# Patient Record
Sex: Female | Born: 1960 | Race: White | Hispanic: No | Marital: Married | Smoking: Never smoker
Health system: Southern US, Community
[De-identification: ages and names within clinical notes are randomized; demographics above are authoritative.]

## PROBLEM LIST (undated history)

## (undated) DIAGNOSIS — J45909 Unspecified asthma, uncomplicated: Secondary | ICD-10-CM

## (undated) DIAGNOSIS — F32A Depression, unspecified: Secondary | ICD-10-CM

## (undated) DIAGNOSIS — D649 Anemia, unspecified: Secondary | ICD-10-CM

## (undated) DIAGNOSIS — G4730 Sleep apnea, unspecified: Secondary | ICD-10-CM

## (undated) DIAGNOSIS — F419 Anxiety disorder, unspecified: Secondary | ICD-10-CM

## (undated) DIAGNOSIS — T7840XA Allergy, unspecified, initial encounter: Secondary | ICD-10-CM

## (undated) HISTORY — DX: Unspecified asthma, uncomplicated: J45.909

## (undated) HISTORY — DX: Anemia, unspecified: D64.9

## (undated) HISTORY — DX: Anxiety disorder, unspecified: F41.9

## (undated) HISTORY — DX: Sleep apnea, unspecified: G47.30

## (undated) HISTORY — DX: Allergy, unspecified, initial encounter: T78.40XA

## (undated) HISTORY — DX: Depression, unspecified: F32.A

---

## 1992-11-04 HISTORY — PX: DILATION AND CURETTAGE OF UTERUS: SHX78

## 2005-09-05 ENCOUNTER — Encounter: Payer: Self-pay | Admitting: Pulmonary Disease

## 2010-04-03 ENCOUNTER — Encounter: Payer: Self-pay | Admitting: Pulmonary Disease

## 2010-04-03 ENCOUNTER — Ambulatory Visit (HOSPITAL_BASED_OUTPATIENT_CLINIC_OR_DEPARTMENT_OTHER): Admission: RE | Admit: 2010-04-03 | Discharge: 2010-04-03 | Payer: Self-pay | Admitting: Otolaryngology

## 2010-04-06 ENCOUNTER — Ambulatory Visit: Payer: Self-pay | Admitting: Internal Medicine

## 2010-06-29 ENCOUNTER — Ambulatory Visit: Payer: Self-pay | Admitting: Pulmonary Disease

## 2010-06-29 DIAGNOSIS — J309 Allergic rhinitis, unspecified: Secondary | ICD-10-CM | POA: Insufficient documentation

## 2010-06-29 DIAGNOSIS — G4733 Obstructive sleep apnea (adult) (pediatric): Secondary | ICD-10-CM | POA: Insufficient documentation

## 2010-12-04 NOTE — Assessment & Plan Note (Signed)
Summary: consult for osa   Copy to:  [facility] Jasmine Cardenas/Referring Jasmine Cardenas:  Dr. Maisie Cardenas ([facility])  CC:  Sleep Consu.  History of Present Illness: The pt is a 50y/o female who I have been asked to see for management of osa.  She had a sleep study in 2006 at [facility] where she had mild osa, with AHI of 18/hr and desat to 84%.  She underwent a sleep study at [facility] in May of this year, and found to have severe osa, with AHI 90/hr and desat to 78%.  She was placed on cpap, and found to have optimal pressure of 22cm.  She has been started on cpap, and having multiple tolerance issues.  She currently is on auto setting, with her machine spending a fairly significant amount of time at a pressure of 19-20cm.  Despite this, she is compliant with the device, and uses greater than 4 hours 75% of the time in her July readings.  She is c/o head and sinus congestion, along with dizziness.  She has terrrible dryness despite using h/h, and feels the pressure is too much.  She notes facial swelling in the am's, and I suspect that is due to pulling mask too tight.  She feels the number of awakenings during the night have decreased signficantly, and although feels better in am's, does not feel rested.  She has developed a whole new set of am issues as above.  She still has sleepiness during the day, and her epworth score today is 12.  Of note, her weight has increased 25-30 pounds over the last 2 years.  Preventive Screening-Counseling & Management  Alcohol-Tobacco     Smoking Status: never  Current Medications (verified): 1)  Fluticasone Propionate 50 Mcg/act Susp (Fluticasone Propionate) .Marland Kitchen.. 1 Spray Each Nostril Once Daily 2)  Advair Diskus 250-50 Mcg/dose Aepb (Fluticasone-Salmeterol) .Marland Kitchen.. 1 Puff By Inhalation Two Times A Day 3)  Fluoxetine Hcl 20 Mg Tabs (Fluoxetine Hcl) .... Three Times A Day 4)  Patanase 0.6 % Soln (Olopatadine Hcl) .... 2 Sprays Intranasal Route Two Times A  Day in Each Nostril 5)  Proair Hfa 108 (90 Base) Mcg/act  Aers (Albuterol Sulfate) .Marland Kitchen.. 1-2 Puffs Every 4-6 Hours As Needed  Allergies (verified): No Known Drug Allergies  Past History:  Past Medical History:  OBSTRUCTIVE SLEEP APNEA (ICD-327.23)--AHI 90/hr 2011. ALLERGIC RHINITIS (ICD-477.9)    Past Surgical History: D&C  Family History: Reviewed history and no changes required. allergies: father, sister asthma: father heart disease: paternal grandfather cancer: maternal grandmother (lung) maternal grandfather (unsure)   Social History: Reviewed history and no changes required. Patient never smoked.  pt is married and lives with husband, Jasmine Cardenas. Pt has children. pt is a homemaker. Smoking Status:  never  Review of Systems  The patient denies shortness of breath with activity, shortness of breath at rest, productive cough, non-productive cough, coughing up blood, chest pain, irregular heartbeats, acid heartburn, indigestion, loss of appetite, weight change, abdominal pain, difficulty swallowing, sore throat, tooth/dental problems, headaches, nasal congestion/difficulty breathing through nose, sneezing, itching, ear ache, anxiety, depression, hand/feet swelling, joint stiffness or pain, rash, change in color of mucus, and fever.    Vital Signs:  Patient profile:   50 year old female Height:      66.5 inches Weight:      265.50 pounds BMI:     42.36 O2 Sat:      99 % on Room air Temp:     98.9 degrees  F oral Pulse rate:   75 / minute BP sitting:   140 / 70  (left arm) Cuff size:   large  Vitals Entered By: Arman Filter LPN (June 29, 2010 10:22 AM)  O2 Flow:  Room air CC: Sleep Consu Comments Medications reviewed with patient  Arman Filter LPN  June 29, 2010 10:24 AM    Physical Exam  General:  obese female in nad Eyes:  PERRLA and EOMI.   Nose:  narrowed bilat, with turbinate hypertrophy Mouth:  signficant elongation of soft palate and uvula Neck:  no  jvd, tmg, LN Lungs:  clear to auscultation Heart:  rrr, mrg Abdomen:  soft and nontender, bs+ Extremities:  mild edema, no cyanosis pulses intact distally Neurologic:  alert and oriented, moves all 4.   Impression & Recommendations:  Problem # 1:  OBSTRUCTIVE SLEEP APNEA (ICD-327.23) the pt has severe osa by her recent sleep study, with fairly high pressure requirements.  She is having issues with mask leaks and mask comfort, and I suspect it is related to pulling the mask too tight in order to prevent leaks.  She also is having pressure tolerance issues, and air gulping.  I rarely see a pt who can tolerate pressure greater than 18cm.  At this point, I would like to decrease her pressure to 16cm, and accept breakthru apnea as a trade off for improved compliance and better mask comfort.  We can then slowly increase the pressure and see where her breakpoint is.  I also wonder whether she would benefit from nasal/upper airway surgery in order to decrease her pressure needs (not cure).  The pt is agreeable to trying lower pressure, and I have also encouraged her to work aggressively on weight loss.  Medications Added to Medication List This Visit: 1)  Proair Hfa 108 (90 Base) Mcg/act Aers (Albuterol sulfate) .Marland Kitchen.. 1-2 puffs every 4-6 hours as needed  Other Orders: Consultation Level IV (16109) DME Referral (DME) DME Referral (DME)  Patient Instructions: 1)  will decrease cpap to a set pressure of 16cm.  See if you can decrease tension on mask straps. 2)  work on weight loss. 3)  please call me in 3 weeks to give update about tolerance.  We can arrange followup at that time.    Appended Document: consult for osa download since pressure reduction to 16cm shows great compliance, above average time in leak, and average AHI 11.5.  pt needs ov to f/u with me about cpap.  Appended Document: consult for osa attempt to call pt but they said the person i was calling for no longer works their. will  send not out to pt's home stating for her to give our office a call

## 2016-02-16 DIAGNOSIS — G4733 Obstructive sleep apnea (adult) (pediatric): Secondary | ICD-10-CM | POA: Diagnosis not present

## 2016-02-22 DIAGNOSIS — F330 Major depressive disorder, recurrent, mild: Secondary | ICD-10-CM | POA: Diagnosis not present

## 2016-02-27 DIAGNOSIS — G4733 Obstructive sleep apnea (adult) (pediatric): Secondary | ICD-10-CM | POA: Diagnosis not present

## 2016-02-28 DIAGNOSIS — J4520 Mild intermittent asthma, uncomplicated: Secondary | ICD-10-CM | POA: Diagnosis not present

## 2016-02-28 DIAGNOSIS — R5383 Other fatigue: Secondary | ICD-10-CM | POA: Diagnosis not present

## 2016-02-28 DIAGNOSIS — G4733 Obstructive sleep apnea (adult) (pediatric): Secondary | ICD-10-CM | POA: Diagnosis not present

## 2016-03-14 DIAGNOSIS — R252 Cramp and spasm: Secondary | ICD-10-CM | POA: Diagnosis not present

## 2016-03-14 DIAGNOSIS — Z0000 Encounter for general adult medical examination without abnormal findings: Secondary | ICD-10-CM | POA: Diagnosis not present

## 2016-03-14 DIAGNOSIS — Z79899 Other long term (current) drug therapy: Secondary | ICD-10-CM | POA: Diagnosis not present

## 2016-03-14 DIAGNOSIS — Z1389 Encounter for screening for other disorder: Secondary | ICD-10-CM | POA: Diagnosis not present

## 2016-03-29 DIAGNOSIS — E785 Hyperlipidemia, unspecified: Secondary | ICD-10-CM | POA: Diagnosis not present

## 2016-03-29 DIAGNOSIS — R7303 Prediabetes: Secondary | ICD-10-CM | POA: Diagnosis not present

## 2016-03-29 DIAGNOSIS — F330 Major depressive disorder, recurrent, mild: Secondary | ICD-10-CM | POA: Diagnosis not present

## 2016-03-29 DIAGNOSIS — I10 Essential (primary) hypertension: Secondary | ICD-10-CM | POA: Diagnosis not present

## 2016-04-17 DIAGNOSIS — J4520 Mild intermittent asthma, uncomplicated: Secondary | ICD-10-CM | POA: Diagnosis not present

## 2016-04-17 DIAGNOSIS — R5383 Other fatigue: Secondary | ICD-10-CM | POA: Diagnosis not present

## 2016-04-17 DIAGNOSIS — G4733 Obstructive sleep apnea (adult) (pediatric): Secondary | ICD-10-CM | POA: Diagnosis not present

## 2016-04-18 DIAGNOSIS — G4733 Obstructive sleep apnea (adult) (pediatric): Secondary | ICD-10-CM | POA: Diagnosis not present

## 2016-04-22 DIAGNOSIS — F39 Unspecified mood [affective] disorder: Secondary | ICD-10-CM | POA: Diagnosis not present

## 2016-04-22 DIAGNOSIS — N912 Amenorrhea, unspecified: Secondary | ICD-10-CM | POA: Diagnosis not present

## 2016-04-22 DIAGNOSIS — G479 Sleep disorder, unspecified: Secondary | ICD-10-CM | POA: Diagnosis not present

## 2016-04-22 DIAGNOSIS — F330 Major depressive disorder, recurrent, mild: Secondary | ICD-10-CM | POA: Diagnosis not present

## 2016-07-10 DIAGNOSIS — J4520 Mild intermittent asthma, uncomplicated: Secondary | ICD-10-CM | POA: Diagnosis not present

## 2016-07-10 DIAGNOSIS — G4733 Obstructive sleep apnea (adult) (pediatric): Secondary | ICD-10-CM | POA: Diagnosis not present

## 2016-07-10 DIAGNOSIS — R5383 Other fatigue: Secondary | ICD-10-CM | POA: Diagnosis not present

## 2016-07-11 DIAGNOSIS — G4733 Obstructive sleep apnea (adult) (pediatric): Secondary | ICD-10-CM | POA: Diagnosis not present

## 2016-07-19 DIAGNOSIS — N951 Menopausal and female climacteric states: Secondary | ICD-10-CM | POA: Diagnosis not present

## 2016-07-19 DIAGNOSIS — Z01419 Encounter for gynecological examination (general) (routine) without abnormal findings: Secondary | ICD-10-CM | POA: Diagnosis not present

## 2016-07-19 DIAGNOSIS — Z1151 Encounter for screening for human papillomavirus (HPV): Secondary | ICD-10-CM | POA: Diagnosis not present

## 2016-07-19 DIAGNOSIS — Z6841 Body Mass Index (BMI) 40.0 and over, adult: Secondary | ICD-10-CM | POA: Diagnosis not present

## 2016-08-14 DIAGNOSIS — N921 Excessive and frequent menstruation with irregular cycle: Secondary | ICD-10-CM | POA: Diagnosis not present

## 2016-08-14 DIAGNOSIS — Z6841 Body Mass Index (BMI) 40.0 and over, adult: Secondary | ICD-10-CM | POA: Diagnosis not present

## 2016-08-14 DIAGNOSIS — N951 Menopausal and female climacteric states: Secondary | ICD-10-CM | POA: Diagnosis not present

## 2016-08-14 DIAGNOSIS — N920 Excessive and frequent menstruation with regular cycle: Secondary | ICD-10-CM | POA: Diagnosis not present

## 2016-09-07 DIAGNOSIS — G4733 Obstructive sleep apnea (adult) (pediatric): Secondary | ICD-10-CM | POA: Diagnosis not present

## 2016-09-16 DIAGNOSIS — F419 Anxiety disorder, unspecified: Secondary | ICD-10-CM | POA: Diagnosis not present

## 2016-09-16 DIAGNOSIS — Z6841 Body Mass Index (BMI) 40.0 and over, adult: Secondary | ICD-10-CM | POA: Diagnosis not present

## 2016-12-05 DIAGNOSIS — J069 Acute upper respiratory infection, unspecified: Secondary | ICD-10-CM | POA: Diagnosis not present

## 2016-12-05 DIAGNOSIS — J029 Acute pharyngitis, unspecified: Secondary | ICD-10-CM | POA: Diagnosis not present

## 2016-12-05 DIAGNOSIS — R05 Cough: Secondary | ICD-10-CM | POA: Diagnosis not present

## 2017-01-17 DIAGNOSIS — G4733 Obstructive sleep apnea (adult) (pediatric): Secondary | ICD-10-CM | POA: Diagnosis not present

## 2017-01-21 DIAGNOSIS — J4520 Mild intermittent asthma, uncomplicated: Secondary | ICD-10-CM | POA: Diagnosis not present

## 2017-01-21 DIAGNOSIS — J301 Allergic rhinitis due to pollen: Secondary | ICD-10-CM | POA: Diagnosis not present

## 2017-01-21 DIAGNOSIS — G4733 Obstructive sleep apnea (adult) (pediatric): Secondary | ICD-10-CM | POA: Diagnosis not present

## 2017-01-21 DIAGNOSIS — R5383 Other fatigue: Secondary | ICD-10-CM | POA: Diagnosis not present

## 2017-01-29 DIAGNOSIS — R05 Cough: Secondary | ICD-10-CM | POA: Diagnosis not present

## 2017-01-29 DIAGNOSIS — J029 Acute pharyngitis, unspecified: Secondary | ICD-10-CM | POA: Diagnosis not present

## 2017-01-29 DIAGNOSIS — J101 Influenza due to other identified influenza virus with other respiratory manifestations: Secondary | ICD-10-CM | POA: Diagnosis not present

## 2017-02-04 DIAGNOSIS — R509 Fever, unspecified: Secondary | ICD-10-CM | POA: Diagnosis not present

## 2017-02-04 DIAGNOSIS — H65192 Other acute nonsuppurative otitis media, left ear: Secondary | ICD-10-CM | POA: Diagnosis not present

## 2017-02-04 DIAGNOSIS — J101 Influenza due to other identified influenza virus with other respiratory manifestations: Secondary | ICD-10-CM | POA: Diagnosis not present

## 2017-02-04 DIAGNOSIS — R490 Dysphonia: Secondary | ICD-10-CM | POA: Diagnosis not present

## 2017-02-04 DIAGNOSIS — J111 Influenza due to unidentified influenza virus with other respiratory manifestations: Secondary | ICD-10-CM | POA: Diagnosis not present

## 2017-02-24 DIAGNOSIS — J301 Allergic rhinitis due to pollen: Secondary | ICD-10-CM | POA: Diagnosis not present

## 2017-02-26 DIAGNOSIS — E538 Deficiency of other specified B group vitamins: Secondary | ICD-10-CM | POA: Diagnosis not present

## 2017-02-26 DIAGNOSIS — R7303 Prediabetes: Secondary | ICD-10-CM | POA: Diagnosis not present

## 2017-02-26 DIAGNOSIS — I10 Essential (primary) hypertension: Secondary | ICD-10-CM | POA: Diagnosis not present

## 2017-02-26 DIAGNOSIS — E559 Vitamin D deficiency, unspecified: Secondary | ICD-10-CM | POA: Diagnosis not present

## 2017-02-26 DIAGNOSIS — E785 Hyperlipidemia, unspecified: Secondary | ICD-10-CM | POA: Diagnosis not present

## 2017-06-10 DIAGNOSIS — J4520 Mild intermittent asthma, uncomplicated: Secondary | ICD-10-CM | POA: Diagnosis not present

## 2017-06-10 DIAGNOSIS — R5383 Other fatigue: Secondary | ICD-10-CM | POA: Diagnosis not present

## 2017-06-10 DIAGNOSIS — G4733 Obstructive sleep apnea (adult) (pediatric): Secondary | ICD-10-CM | POA: Diagnosis not present

## 2017-06-18 DIAGNOSIS — Z1212 Encounter for screening for malignant neoplasm of rectum: Secondary | ICD-10-CM | POA: Diagnosis not present

## 2017-06-18 DIAGNOSIS — Z1389 Encounter for screening for other disorder: Secondary | ICD-10-CM | POA: Diagnosis not present

## 2017-06-18 DIAGNOSIS — Z1211 Encounter for screening for malignant neoplasm of colon: Secondary | ICD-10-CM | POA: Diagnosis not present

## 2017-06-18 DIAGNOSIS — Z0000 Encounter for general adult medical examination without abnormal findings: Secondary | ICD-10-CM | POA: Diagnosis not present

## 2017-12-09 DIAGNOSIS — G4733 Obstructive sleep apnea (adult) (pediatric): Secondary | ICD-10-CM | POA: Diagnosis not present

## 2017-12-09 DIAGNOSIS — J4520 Mild intermittent asthma, uncomplicated: Secondary | ICD-10-CM | POA: Diagnosis not present

## 2017-12-15 DIAGNOSIS — G4733 Obstructive sleep apnea (adult) (pediatric): Secondary | ICD-10-CM | POA: Diagnosis not present

## 2017-12-29 DIAGNOSIS — R21 Rash and other nonspecific skin eruption: Secondary | ICD-10-CM | POA: Diagnosis not present

## 2017-12-29 DIAGNOSIS — F330 Major depressive disorder, recurrent, mild: Secondary | ICD-10-CM | POA: Diagnosis not present

## 2018-02-03 DIAGNOSIS — I10 Essential (primary) hypertension: Secondary | ICD-10-CM | POA: Diagnosis not present

## 2018-02-03 DIAGNOSIS — E559 Vitamin D deficiency, unspecified: Secondary | ICD-10-CM | POA: Diagnosis not present

## 2018-02-03 DIAGNOSIS — E785 Hyperlipidemia, unspecified: Secondary | ICD-10-CM | POA: Diagnosis not present

## 2018-02-03 DIAGNOSIS — R7303 Prediabetes: Secondary | ICD-10-CM | POA: Diagnosis not present

## 2018-02-03 DIAGNOSIS — E538 Deficiency of other specified B group vitamins: Secondary | ICD-10-CM | POA: Diagnosis not present

## 2018-03-06 DIAGNOSIS — G4733 Obstructive sleep apnea (adult) (pediatric): Secondary | ICD-10-CM | POA: Diagnosis not present

## 2018-06-09 DIAGNOSIS — G4733 Obstructive sleep apnea (adult) (pediatric): Secondary | ICD-10-CM | POA: Diagnosis not present

## 2018-06-10 DIAGNOSIS — R5383 Other fatigue: Secondary | ICD-10-CM | POA: Diagnosis not present

## 2018-06-10 DIAGNOSIS — G4733 Obstructive sleep apnea (adult) (pediatric): Secondary | ICD-10-CM | POA: Diagnosis not present

## 2018-06-10 DIAGNOSIS — J4520 Mild intermittent asthma, uncomplicated: Secondary | ICD-10-CM | POA: Diagnosis not present

## 2018-08-06 DIAGNOSIS — Z1331 Encounter for screening for depression: Secondary | ICD-10-CM | POA: Diagnosis not present

## 2018-08-06 DIAGNOSIS — Z0000 Encounter for general adult medical examination without abnormal findings: Secondary | ICD-10-CM | POA: Diagnosis not present

## 2018-08-10 DIAGNOSIS — R599 Enlarged lymph nodes, unspecified: Secondary | ICD-10-CM | POA: Diagnosis not present

## 2018-08-10 DIAGNOSIS — R590 Localized enlarged lymph nodes: Secondary | ICD-10-CM | POA: Diagnosis not present

## 2018-08-25 DIAGNOSIS — R590 Localized enlarged lymph nodes: Secondary | ICD-10-CM | POA: Diagnosis not present

## 2018-08-25 DIAGNOSIS — I10 Essential (primary) hypertension: Secondary | ICD-10-CM | POA: Diagnosis not present

## 2018-08-25 DIAGNOSIS — Z1231 Encounter for screening mammogram for malignant neoplasm of breast: Secondary | ICD-10-CM | POA: Diagnosis not present

## 2018-08-25 DIAGNOSIS — J328 Other chronic sinusitis: Secondary | ICD-10-CM | POA: Diagnosis not present

## 2018-11-27 DIAGNOSIS — J069 Acute upper respiratory infection, unspecified: Secondary | ICD-10-CM | POA: Diagnosis not present

## 2018-11-27 DIAGNOSIS — R590 Localized enlarged lymph nodes: Secondary | ICD-10-CM | POA: Diagnosis not present

## 2018-11-27 DIAGNOSIS — Z6834 Body mass index (BMI) 34.0-34.9, adult: Secondary | ICD-10-CM | POA: Diagnosis not present

## 2018-12-08 DIAGNOSIS — R5383 Other fatigue: Secondary | ICD-10-CM | POA: Diagnosis not present

## 2018-12-08 DIAGNOSIS — G4733 Obstructive sleep apnea (adult) (pediatric): Secondary | ICD-10-CM | POA: Diagnosis not present

## 2018-12-08 DIAGNOSIS — J4520 Mild intermittent asthma, uncomplicated: Secondary | ICD-10-CM | POA: Diagnosis not present

## 2019-02-08 DIAGNOSIS — F330 Major depressive disorder, recurrent, mild: Secondary | ICD-10-CM | POA: Diagnosis not present

## 2019-02-08 DIAGNOSIS — E559 Vitamin D deficiency, unspecified: Secondary | ICD-10-CM | POA: Diagnosis not present

## 2019-02-08 DIAGNOSIS — D509 Iron deficiency anemia, unspecified: Secondary | ICD-10-CM | POA: Diagnosis not present

## 2019-02-08 DIAGNOSIS — J45909 Unspecified asthma, uncomplicated: Secondary | ICD-10-CM | POA: Diagnosis not present

## 2019-06-30 DIAGNOSIS — J309 Allergic rhinitis, unspecified: Secondary | ICD-10-CM | POA: Diagnosis not present

## 2019-06-30 DIAGNOSIS — G4733 Obstructive sleep apnea (adult) (pediatric): Secondary | ICD-10-CM | POA: Diagnosis not present

## 2019-06-30 DIAGNOSIS — R5383 Other fatigue: Secondary | ICD-10-CM | POA: Diagnosis not present

## 2019-08-09 DIAGNOSIS — Z0000 Encounter for general adult medical examination without abnormal findings: Secondary | ICD-10-CM | POA: Diagnosis not present

## 2019-08-09 DIAGNOSIS — Z6837 Body mass index (BMI) 37.0-37.9, adult: Secondary | ICD-10-CM | POA: Diagnosis not present

## 2019-08-09 DIAGNOSIS — Z13220 Encounter for screening for lipoid disorders: Secondary | ICD-10-CM | POA: Diagnosis not present

## 2019-08-09 DIAGNOSIS — Z1211 Encounter for screening for malignant neoplasm of colon: Secondary | ICD-10-CM | POA: Diagnosis not present

## 2019-08-09 DIAGNOSIS — Z1231 Encounter for screening mammogram for malignant neoplasm of breast: Secondary | ICD-10-CM | POA: Diagnosis not present

## 2019-08-13 ENCOUNTER — Encounter: Payer: Self-pay | Admitting: Gastroenterology

## 2019-09-06 DIAGNOSIS — Z1231 Encounter for screening mammogram for malignant neoplasm of breast: Secondary | ICD-10-CM | POA: Diagnosis not present

## 2019-09-16 ENCOUNTER — Ambulatory Visit: Payer: Self-pay | Admitting: Gastroenterology

## 2019-09-20 ENCOUNTER — Encounter: Payer: Self-pay | Admitting: Family Medicine

## 2019-12-24 DIAGNOSIS — R5383 Other fatigue: Secondary | ICD-10-CM | POA: Diagnosis not present

## 2019-12-24 DIAGNOSIS — J4520 Mild intermittent asthma, uncomplicated: Secondary | ICD-10-CM | POA: Diagnosis not present

## 2019-12-24 DIAGNOSIS — G4733 Obstructive sleep apnea (adult) (pediatric): Secondary | ICD-10-CM | POA: Diagnosis not present

## 2020-06-27 DIAGNOSIS — J4520 Mild intermittent asthma, uncomplicated: Secondary | ICD-10-CM | POA: Diagnosis not present

## 2020-06-27 DIAGNOSIS — R5383 Other fatigue: Secondary | ICD-10-CM | POA: Diagnosis not present

## 2020-06-27 DIAGNOSIS — G4733 Obstructive sleep apnea (adult) (pediatric): Secondary | ICD-10-CM | POA: Diagnosis not present

## 2020-08-09 DIAGNOSIS — E559 Vitamin D deficiency, unspecified: Secondary | ICD-10-CM | POA: Diagnosis not present

## 2020-08-09 DIAGNOSIS — Z832 Family history of diseases of the blood and blood-forming organs and certain disorders involving the immune mechanism: Secondary | ICD-10-CM | POA: Diagnosis not present

## 2020-08-09 DIAGNOSIS — Z0000 Encounter for general adult medical examination without abnormal findings: Secondary | ICD-10-CM | POA: Diagnosis not present

## 2020-08-11 ENCOUNTER — Encounter: Payer: Self-pay | Admitting: Gastroenterology

## 2020-09-13 DIAGNOSIS — R5383 Other fatigue: Secondary | ICD-10-CM | POA: Diagnosis not present

## 2020-09-13 DIAGNOSIS — G4733 Obstructive sleep apnea (adult) (pediatric): Secondary | ICD-10-CM | POA: Diagnosis not present

## 2020-09-13 DIAGNOSIS — J4520 Mild intermittent asthma, uncomplicated: Secondary | ICD-10-CM | POA: Diagnosis not present

## 2020-09-25 ENCOUNTER — Encounter: Payer: Self-pay | Admitting: Gastroenterology

## 2020-09-25 ENCOUNTER — Ambulatory Visit (AMBULATORY_SURGERY_CENTER): Payer: Self-pay | Admitting: *Deleted

## 2020-09-25 ENCOUNTER — Other Ambulatory Visit: Payer: Self-pay

## 2020-09-25 VITALS — Ht 66.0 in | Wt 273.0 lb

## 2020-09-25 DIAGNOSIS — Z1211 Encounter for screening for malignant neoplasm of colon: Secondary | ICD-10-CM

## 2020-09-25 MED ORDER — SUTAB 1479-225-188 MG PO TABS: 1.0000 | ORAL_TABLET | ORAL | 0 refills | Status: DC

## 2020-09-25 NOTE — Progress Notes (Signed)
Patient is here in-person for PV. Patient denies any allergies to eggs or soy. Patient denies any problems with anesthesia/sedation. Patient denies any oxygen use at home. Patient denies taking any diet/weight loss medications or blood thinners. Patient is not being treated for MRSA or C-diff. Patient is aware of our care-partner policy and Covid-19 safety protocol. EMMI education assigned to the patient for the procedure, pt is aware.   COVID-19 vaccines completed on 08/29/2020 booster, per patient.   Prep Prescription coupon given to the patient.

## 2020-10-03 ENCOUNTER — Telehealth: Payer: Self-pay | Admitting: Gastroenterology

## 2020-10-03 DIAGNOSIS — Z1211 Encounter for screening for malignant neoplasm of colon: Secondary | ICD-10-CM

## 2020-10-03 MED ORDER — SUTAB 1479-225-188 MG PO TABS: 24.0000 | ORAL_TABLET | ORAL | 0 refills | Status: DC

## 2020-10-03 NOTE — Telephone Encounter (Signed)
Sent in [place] with coupon codes to [facility] as per pt's request

## 2020-10-09 ENCOUNTER — Ambulatory Visit (AMBULATORY_SURGERY_CENTER): Payer: BC Managed Care – PPO | Admitting: Gastroenterology

## 2020-10-09 ENCOUNTER — Other Ambulatory Visit: Payer: Self-pay

## 2020-10-09 ENCOUNTER — Encounter: Payer: Self-pay | Admitting: Gastroenterology

## 2020-10-09 VITALS — BP 151/85 | HR 79 | Temp 97.8°F | Resp 12 | Ht 66.0 in | Wt 273.0 lb

## 2020-10-09 DIAGNOSIS — Z1211 Encounter for screening for malignant neoplasm of colon: Secondary | ICD-10-CM

## 2020-10-09 DIAGNOSIS — Z800 Family history of malignant neoplasm of digestive organs: Secondary | ICD-10-CM

## 2020-10-09 MED ORDER — SODIUM CHLORIDE 0.9 % IV SOLN: 500.0000 mL | INTRAVENOUS | Status: AC

## 2020-10-09 NOTE — Op Note (Signed)
[facility] Patient Name: Jasmine LeydenKaren Cardenas Procedure Date: 10/09/2020 8:22 AM MRN: [id number] Endoscopist: Lynann Bolognaajesh Kassity Woodson , MD Age: 59 Referring MD:  Date of Birth: 1960/12/05 Gender: Female Account #: [id number] Procedure:                Colonoscopy Indications:              Colon cancer screening in patient at increased                            risk: Family history of two 1st-degree relative                            with colon polyps (sis before age 59 years, dad>60) Medicines:                Monitored Anesthesia Care Procedure:                Pre-Anesthesia Assessment:                           - Prior to the procedure, a History and Physical                            was performed, and patient medications and                            allergies were reviewed. The patient's tolerance of                            previous anesthesia was also reviewed. The risks                            and benefits of the procedure and the sedation                            options and risks were discussed with the patient.                            All questions were answered, and informed consent                            was obtained. Prior Anticoagulants: The patient has                            taken no previous anticoagulant or antiplatelet                            agents. ASA Grade Assessment: III - A patient with                            severe systemic disease. After reviewing the risks                            and benefits, the patient was deemed in  satisfactory condition to undergo the procedure.                           After obtaining informed consent, the colonoscope                            was passed under direct vision. Throughout the                            procedure, the patient's blood pressure, pulse, and                            oxygen saturations were monitored continuously. The                            Colonoscope  was introduced through the anus and                            advanced to the 2 cm into the ileum. The                            colonoscopy was performed without difficulty. The                            patient tolerated the procedure well. The quality                            of the bowel preparation was good. The terminal                            ileum, ileocecal valve, appendiceal orifice, and                            rectum were photographed. Scope In: 8:30:11 AM Scope Out: 8:44:40 AM Scope Withdrawal Time: 0 hours 9 minutes 5 seconds  Total Procedure Duration: 0 hours 14 minutes 29 seconds  Findings:                 Few medium-mouthed diverticula were found in the                            sigmoid colon, descending colon and ascending colon.                           Non-bleeding internal hemorrhoids were found during                            retroflexion. The hemorrhoids were small.                           The terminal ileum appeared normal.                           The exam was otherwise without abnormality on  direct and retroflexion views. Complications:            No immediate complications. Estimated Blood Loss:     Estimated blood loss: none. Impression:               - Mild pancolonic diverticulosis.                           - Non-bleeding internal hemorrhoids.                           - The examined portion of the ileum was normal.                           - The examination was otherwise normal on direct                            and retroflexion views.                           - No specimens collected. Recommendation:           - Patient has a contact number available for                            emergencies. The signs and symptoms of potential                            delayed complications were discussed with the                            patient. Return to normal activities tomorrow.                            Written  discharge instructions were provided to the                            patient.                           - Resume previous diet.                           - Continue present medications.                           - Repeat colonoscopy in 5 years for screening                            purposes. Earlier, if with any new problems or                            change in family history.                           - Return to GI office PRN.                           -  The findings and recommendations were discussed                            with the patient's family (Tommy). Lynann Bologna, MD 10/09/2020 8:50:50 AM This report has been signed electronically.

## 2020-10-09 NOTE — Progress Notes (Signed)
Pt's states no medical or surgical changes since previsit  

## 2020-10-09 NOTE — Discharge Instructions (Signed)
Resume previous medications. Handouts on findings given to patient. Await pathology for final recommendations.  YOU HAD AN ENDOSCOPIC PROCEDURE TODAY AT THE [facility]:   Refer to the procedure report that was given to you for any specific questions about what was found during the examination.  If the procedure report does not answer your questions, please call your gastroenterologist to clarify.  If you requested that your care partner not be given the details of your procedure findings, then the procedure report has been included in a sealed envelope for you to review at your convenience later.  YOU SHOULD EXPECT: Some feelings of bloating in the abdomen. Passage of more gas than usual.  Walking can help get rid of the air that was put into your GI tract during the procedure and reduce the bloating. If you had a lower endoscopy (such as a colonoscopy or flexible sigmoidoscopy) you may notice spotting of blood in your stool or on the toilet paper. If you underwent a bowel prep for your procedure, you may not have a normal bowel movement for a few days.  Please Note:  You might notice some irritation and congestion in your nose or some drainage.  This is from the oxygen used during your procedure.  There is no need for concern and it should clear up in a day or so.  SYMPTOMS TO REPORT IMMEDIATELY:   Following lower endoscopy (colonoscopy or flexible sigmoidoscopy):  Excessive amounts of blood in the stool  Significant tenderness or worsening of abdominal pains  Swelling of the abdomen that is new, acute  Fever of 100F or higher  For urgent or emergent issues, a gastroenterologist can be reached at any hour by calling [phone]. Do not use MyChart messaging for urgent concerns.    DIET:  We do recommend a small meal at first, but then you may proceed to your regular diet.  Drink plenty of fluids but you should avoid alcoholic beverages for 24 hours.  ACTIVITY:  You should  plan to take it easy for the rest of today and you should NOT DRIVE or use heavy machinery until tomorrow (because of the sedation medicines used during the test).    FOLLOW UP: Our staff will call the number listed on your records 48-72 hours following your procedure to check on you and address any questions or concerns that you may have regarding the information given to you following your procedure. If we do not reach you, we will leave a message.  We will attempt to reach you two times.  During this call, we will ask if you have developed any symptoms of COVID 19. If you develop any symptoms (ie: fever, flu-like symptoms, shortness of breath, cough etc.) before then, please call [phone].  If you test positive for Covid 19 in the 2 weeks post procedure, please call and report this information to us.    If any biopsies were taken you will be contacted by phone or by letter within the next 1-3 weeks.  Please call us at [phone] if you have not heard about the biopsies in 3 weeks.    SIGNATURES/CONFIDENTIALITY: You and/or your care partner have signed paperwork which will be entered into your electronic medical record.  These signatures attest to the fact that that the information above on your After Visit Summary has been reviewed and is understood.  Full responsibility of the confidentiality of this discharge information lies with you and/or your care-partner. 

## 2020-10-09 NOTE — Progress Notes (Signed)
PT taken to PACU. Monitors in place. VSS. Report given to RN. 

## 2020-10-11 ENCOUNTER — Telehealth: Payer: Self-pay

## 2020-10-11 NOTE — Telephone Encounter (Signed)
  Follow up Call-  Call back number 10/09/2020  Post procedure Call Back phone  # [phone]  Permission to leave phone message Yes  Some recent data might be hidden     Patient questions:  Do you have a fever, pain , or abdominal swelling? No. Pain Score  0 *  Have you tolerated food without any problems? Yes.    Have you been able to return to your normal activities? Yes.    Do you have any questions about your discharge instructions: Diet   No. Medications  No. Follow up visit  No.  Do you have questions or concerns about your Care? No.  Actions: * If pain score is 4 or above: No action needed, pain <4.  1. Have you developed a fever since your procedure? no  2.   Have you had an respiratory symptoms (SOB or cough) since your procedure? no  3.   Have you tested positive for COVID 19 since your procedure no  4.   Have you had any family members/close contacts diagnosed with the COVID 19 since your procedure?  no   If yes to any of these questions please route to Laverna Peace, RN and Karlton Lemon, RN

## 2020-10-18 DIAGNOSIS — J4520 Mild intermittent asthma, uncomplicated: Secondary | ICD-10-CM | POA: Diagnosis not present

## 2020-10-18 DIAGNOSIS — R5383 Other fatigue: Secondary | ICD-10-CM | POA: Diagnosis not present

## 2020-10-18 DIAGNOSIS — G4733 Obstructive sleep apnea (adult) (pediatric): Secondary | ICD-10-CM | POA: Diagnosis not present

## 2020-10-31 DIAGNOSIS — J4520 Mild intermittent asthma, uncomplicated: Secondary | ICD-10-CM | POA: Diagnosis not present

## 2020-10-31 DIAGNOSIS — R5383 Other fatigue: Secondary | ICD-10-CM | POA: Diagnosis not present

## 2020-10-31 DIAGNOSIS — G4733 Obstructive sleep apnea (adult) (pediatric): Secondary | ICD-10-CM | POA: Diagnosis not present

## 2020-11-09 DIAGNOSIS — J90 Pleural effusion, not elsewhere classified: Secondary | ICD-10-CM | POA: Diagnosis not present

## 2020-11-09 DIAGNOSIS — R0781 Pleurodynia: Secondary | ICD-10-CM | POA: Diagnosis not present

## 2020-11-09 DIAGNOSIS — R059 Cough, unspecified: Secondary | ICD-10-CM | POA: Diagnosis not present

## 2020-11-09 DIAGNOSIS — Z23 Encounter for immunization: Secondary | ICD-10-CM | POA: Diagnosis not present

## 2020-11-13 ENCOUNTER — Emergency Department (HOSPITAL_COMMUNITY)
Admission: EM | Admit: 2020-11-13 | Discharge: 2020-11-13 | Discharge status: Absent without leave | Payer: BC Managed Care – PPO

## 2020-11-15 ENCOUNTER — Other Ambulatory Visit: Payer: Self-pay | Admitting: Family Medicine

## 2020-11-15 ENCOUNTER — Ambulatory Visit
Admission: RE | Admit: 2020-11-15 | Discharge: 2020-11-15 | Disposition: A | Discharge status: Home / Self Care | Payer: BC Managed Care – PPO | Source: Ambulatory Visit | Attending: Family Medicine | Admitting: Family Medicine

## 2020-11-15 DIAGNOSIS — J90 Pleural effusion, not elsewhere classified: Secondary | ICD-10-CM | POA: Diagnosis not present

## 2020-11-15 DIAGNOSIS — R079 Chest pain, unspecified: Secondary | ICD-10-CM | POA: Diagnosis not present

## 2020-11-15 DIAGNOSIS — R0602 Shortness of breath: Secondary | ICD-10-CM | POA: Diagnosis not present

## 2020-11-15 DIAGNOSIS — J189 Pneumonia, unspecified organism: Secondary | ICD-10-CM | POA: Diagnosis not present

## 2020-11-15 MED ORDER — IOPAMIDOL (ISOVUE-370) INJECTION 76%
75.0000 mL | Freq: Once | INTRAVENOUS | Status: AC | PRN
  Administered 2020-11-15: 75 mL via INTRAVENOUS

## 2020-11-22 DIAGNOSIS — J45909 Unspecified asthma, uncomplicated: Secondary | ICD-10-CM | POA: Diagnosis not present

## 2020-11-22 DIAGNOSIS — R0781 Pleurodynia: Secondary | ICD-10-CM | POA: Diagnosis not present

## 2020-11-22 DIAGNOSIS — J90 Pleural effusion, not elsewhere classified: Secondary | ICD-10-CM | POA: Diagnosis not present

## 2020-11-22 DIAGNOSIS — I10 Essential (primary) hypertension: Secondary | ICD-10-CM | POA: Diagnosis not present

## 2020-11-30 ENCOUNTER — Institutional Professional Consult (permissible substitution): Payer: BC Managed Care – PPO | Admitting: Pulmonary Disease

## 2020-12-06 ENCOUNTER — Other Ambulatory Visit: Payer: Self-pay | Admitting: Family Medicine

## 2020-12-06 ENCOUNTER — Other Ambulatory Visit: Payer: Self-pay

## 2020-12-06 ENCOUNTER — Ambulatory Visit
Admission: RE | Admit: 2020-12-06 | Discharge: 2020-12-06 | Disposition: A | Discharge status: Home / Self Care | Payer: BC Managed Care – PPO | Source: Ambulatory Visit | Attending: Family Medicine | Admitting: Family Medicine

## 2020-12-06 DIAGNOSIS — F330 Major depressive disorder, recurrent, mild: Secondary | ICD-10-CM

## 2020-12-06 DIAGNOSIS — R079 Chest pain, unspecified: Secondary | ICD-10-CM | POA: Diagnosis not present

## 2020-12-11 ENCOUNTER — Encounter: Payer: Self-pay | Admitting: Pulmonary Disease

## 2020-12-11 ENCOUNTER — Other Ambulatory Visit: Payer: Self-pay

## 2020-12-11 ENCOUNTER — Ambulatory Visit (INDEPENDENT_AMBULATORY_CARE_PROVIDER_SITE_OTHER): Payer: BC Managed Care – PPO | Admitting: Pulmonary Disease

## 2020-12-11 VITALS — BP 150/80 | HR 77 | Temp 98.0°F | Ht 66.0 in | Wt 275.0 lb

## 2020-12-11 DIAGNOSIS — G4733 Obstructive sleep apnea (adult) (pediatric): Secondary | ICD-10-CM | POA: Diagnosis not present

## 2020-12-11 DIAGNOSIS — J90 Pleural effusion, not elsewhere classified: Secondary | ICD-10-CM | POA: Diagnosis not present

## 2020-12-11 DIAGNOSIS — J4520 Mild intermittent asthma, uncomplicated: Secondary | ICD-10-CM | POA: Diagnosis not present

## 2020-12-11 NOTE — Progress Notes (Signed)
Synopsis: Referred in January 2022 for Pleural Effusion  Subjective:   PATIENT ID: Jasmine Cardenas GENDER: female DOB: 1961-06-03, MRN: [id number]   HPI  Chief Complaint  Patient presents with  . Consult   Carlita Whitcomb is a 60 year old woman, never smoker with asthma and obstructive sleep apnea on bipap who is referred to pulmonary clinic for pleural effusions.   She has been followed by Dr. Blenda Nicely at [facility] for the past few years. She had an in-lab sleep study performed last year in which she paid out of pocket. She was previously on CPAP therapy from 2011 to 2016. Her Bipap settings prior to the sleep study were IPAP 13cmH2O and EPAP 9cmH2O. After the study her settings were increased to 18/13 with a reduction in her AHI but then she reported significant thoracic cage pain after these changes and she was having coughing fits. She continues to have discomfort of her ribs and lower back since these events but they are slowly improving. Her bipap machine was then switched to an auto adjusting setting which she did not tolerate well, so her settings returned to 13/9 and her data download for the last month shows an AHI of 43.   After she experienced the chest discomforts and developed coughing fits, she presented to the hospital where a CT chest was performed in 12/2021and noted bilateral small effusions which prompted today's referral. On chest radiograph on 12/06/2020 there were no effusions noted. She was given doxycycline with improvement in her cough.   She is on Trelegy Ellipta daily for her asthma. She reports she has not noted much difference since being on the trelegy over the past 6 months. She has noticed the most benefit from [facility] but her insurance does not cover this reportedly. She rarely uses an albuterol rescue inhaler. She denies heart burn symptoms.   She has gained 60lbs since the pandemic started.   Past Medical History:  Diagnosis Date  .  Allergy   . Anemia   . Anxiety   . Asthma   . Depression   . Sleep apnea    uses BiPAP     Family History  Problem Relation Age of Onset  . Colon polyps Father   . Colon polyps Sister   . Colon cancer Neg Hx   . Esophageal cancer Neg Hx   . Rectal cancer Neg Hx   . Stomach cancer Neg Hx      Social History   Socioeconomic History  . Marital status: Married    Spouse name: Not on file  . Number of children: Not on file  . Years of education: Not on file  . Highest education level: Not on file  Occupational History  . Not on file  Tobacco Use  . Smoking status: Never Smoker  . Smokeless tobacco: Never Used  Vaping Use  . Vaping Use: Never used  Substance and Sexual Activity  . Alcohol use: Not Currently  . Drug use: Not Currently  . Sexual activity: Not on file  Other Topics Concern  . Not on file  Social History Narrative  . Not on file   Social Determinants of Health   Financial Resource Strain: Not on file  Food Insecurity: Not on file  Transportation Needs: Not on file  Physical Activity: Not on file  Stress: Not on file  Social Connections: Not on file  Intimate Partner Violence: Not on file     Allergies  Allergen Reactions  . Fluoxetine Other (See Comments)    Increase Anxiety Level     Outpatient Medications Prior to Visit  Medication Sig Dispense Refill  . albuterol (VENTOLIN HFA) 108 (90 Base) MCG/ACT inhaler     . busPIRone (BUSPAR) 15 MG tablet Take 15 mg by mouth 2 (two) times daily as needed.    . carbonyl iron (FEOSOL) 45 MG TABS tablet Take by mouth.    . Coenzyme Q10 (CO Q 10 PO) Take by mouth.    . ergocalciferol (VITAMIN D2) 1.25 MG (50000 UT) capsule     . escitalopram (LEXAPRO) 20 MG tablet Take 20 mg by mouth daily.    Marland Kitchen ibuprofen (ADVIL) 200 MG tablet Take 400 mg by mouth every 6 (six) hours as needed.    . L-THEANINE PO Take 100 mg by mouth as needed.    Marland Kitchen losartan (COZAAR) 25 MG tablet Take 25 mg by mouth daily.    .  melatonin 5 MG TABS Take 5 mg by mouth.    . montelukast (SINGULAIR) 10 MG tablet Take 10 mg by mouth daily.    . TRELEGY ELLIPTA 100-62.5-25 MCG/INH AEPB Take 1 puff by mouth daily.     Facility-Administered Medications Prior to Visit  Medication Dose Route Frequency Provider Last Rate Last Admin  . 0.9 %  sodium chloride infusion  500 mL Intravenous Continuous Lynann Bologna, MD        Review of Systems  Constitutional: Negative for chills, fever, malaise/fatigue and weight loss.  HENT: Negative for congestion, sinus pain and sore throat.   Eyes: Negative.   Respiratory: Negative for cough, hemoptysis, sputum production, shortness of breath and wheezing.   Cardiovascular: Positive for chest pain. Negative for palpitations, orthopnea, claudication and leg swelling.  Gastrointestinal: Negative for abdominal pain, heartburn, nausea and vomiting.  Genitourinary: Negative.   Musculoskeletal: Negative for joint pain and myalgias.  Skin: Negative for rash.  Neurological: Positive for headaches. Negative for weakness.  Endo/Heme/Allergies: Negative.   Psychiatric/Behavioral: Negative.    Objective:   Vitals:   12/11/20 1418  BP: (!) 150/80  Pulse: 77  Temp: 98 F (36.7 C)  TempSrc: Oral  SpO2: 98%  Weight: 275 lb (124.7 kg)  Height: 5\' 6"  (1.676 m)     Physical Exam Constitutional:      General: She is not in acute distress.    Appearance: She is obese. She is not ill-appearing.  HENT:     Head: Normocephalic and atraumatic.  Eyes:     General: No scleral icterus.    Conjunctiva/sclera: Conjunctivae normal.     Pupils: Pupils are equal, round, and reactive to light.  Cardiovascular:     Rate and Rhythm: Normal rate and regular rhythm.     Pulses: Normal pulses.     Heart sounds: Normal heart sounds. No murmur heard.   Pulmonary:     Effort: Pulmonary effort is normal.     Breath sounds: Decreased air movement present. No wheezing, rhonchi or rales.  Abdominal:      General: Bowel sounds are normal.     Palpations: Abdomen is soft.  Musculoskeletal:     Right lower leg: No edema.     Left lower leg: No edema.  Lymphadenopathy:     Cervical: No cervical adenopathy.  Skin:    General: Skin is warm and dry.  Neurological:     General: No focal deficit present.     Mental Status: She is alert.  Psychiatric:  Mood and Affect: Mood normal.        Behavior: Behavior normal.        Thought Content: Thought content normal.        Judgment: Judgment normal.    CBC No results found for: WBC, RBC, HGB, HCT, PLT, MCV, MCH, MCHC, RDW, LYMPHSABS, MONOABS, EOSABS, BASOSABS   Chest imaging: CTA Chest 11/15/20 Mediastinum/Nodes: Thoracic inlet structures are normal. No axillary no hilar adenopathy. No mediastinal adenopathy. Thoracic inlet structures are normal.  Lungs/Pleura: Trace ground-glass in the lingula. No dense consolidation. Trace pleural fluid at the lung bases with minimal basilar atelectasis airways are patent.  CXR 12/06/20 Cardiac shadow is within normal limits. Aortic calcifications are seen. Lungs are well aerated bilaterally. No focal infiltrate or sizable effusion is seen. No acute bony abnormality is noted.  PFT: No flowsheet data found.  Assessment & Plan:   OBSTRUCTIVE SLEEP APNEA  Mild intermittent asthma, unspecified whether complicated  Pleural effusion, bilateral  Discussion: Jasmine Cardenas is a 60 year old woman, never smoker with asthma and obstructive sleep apnea on bipap who is referred to pulmonary clinic for pleural effusions.   Her pleural effusions appear to have resolved on follow up chest imaging. They were also very small in nature.   Her sleep apnea is not well controlled at this time. She has an AHI of 43 on her recent data download today. We will have her follow up with Dr. Craige Cotta in Sleep Medicine for further management. We will obtain the records from Dr. Terrilee Croak office for further review.   She can  continue on Trelegy Ellipta at this time but can likely be de-escalated to ICS/LABA therapy in the future.   Follow up in 1 month with Dr. Craige Cotta.  Melody Comas, MD [facility] Office: [phone]   See Amion for Pager Details     Current Outpatient Medications:  .  albuterol (VENTOLIN HFA) 108 (90 Base) MCG/ACT inhaler, , Disp: , Rfl:  .  busPIRone (BUSPAR) 15 MG tablet, Take 15 mg by mouth 2 (two) times daily as needed., Disp: , Rfl:  .  carbonyl iron (FEOSOL) 45 MG TABS tablet, Take by mouth., Disp: , Rfl:  .  Coenzyme Q10 (CO Q 10 PO), Take by mouth., Disp: , Rfl:  .  ergocalciferol (VITAMIN D2) 1.25 MG (50000 UT) capsule, , Disp: , Rfl:  .  escitalopram (LEXAPRO) 20 MG tablet, Take 20 mg by mouth daily., Disp: , Rfl:  .  ibuprofen (ADVIL) 200 MG tablet, Take 400 mg by mouth every 6 (six) hours as needed., Disp: , Rfl:  .  L-THEANINE PO, Take 100 mg by mouth as needed., Disp: , Rfl:  .  losartan (COZAAR) 25 MG tablet, Take 25 mg by mouth daily., Disp: , Rfl:  .  melatonin 5 MG TABS, Take 5 mg by mouth., Disp: , Rfl:  .  montelukast (SINGULAIR) 10 MG tablet, Take 10 mg by mouth daily., Disp: , Rfl:  .  TRELEGY ELLIPTA 100-62.5-25 MCG/INH AEPB, Take 1 puff by mouth daily., Disp: , Rfl:   Current Facility-Administered Medications:  .  0.9 %  sodium chloride infusion, 500 mL, Intravenous, Continuous, Lynann Bologna, MD

## 2020-12-11 NOTE — Patient Instructions (Addendum)
We will obtain records from Dr. Lytle [place] office   We will schedule you follow up with Dr. Craige Cotta, a sleep specialist for your obstructive sleep apnea.   Continue trelegy ellipta daily and as needed albuterol  Recommend getting back to your weight loss routine

## 2021-01-05 DIAGNOSIS — I10 Essential (primary) hypertension: Secondary | ICD-10-CM | POA: Diagnosis not present

## 2021-01-05 DIAGNOSIS — E785 Hyperlipidemia, unspecified: Secondary | ICD-10-CM | POA: Diagnosis not present

## 2021-01-05 DIAGNOSIS — E538 Deficiency of other specified B group vitamins: Secondary | ICD-10-CM | POA: Diagnosis not present

## 2021-01-05 DIAGNOSIS — E559 Vitamin D deficiency, unspecified: Secondary | ICD-10-CM | POA: Diagnosis not present

## 2021-01-05 DIAGNOSIS — D509 Iron deficiency anemia, unspecified: Secondary | ICD-10-CM | POA: Diagnosis not present

## 2021-01-05 DIAGNOSIS — J45909 Unspecified asthma, uncomplicated: Secondary | ICD-10-CM | POA: Diagnosis not present

## 2021-01-12 ENCOUNTER — Ambulatory Visit: Payer: BC Managed Care – PPO | Admitting: Pulmonary Disease

## 2021-01-15 ENCOUNTER — Other Ambulatory Visit: Payer: Self-pay

## 2021-01-15 ENCOUNTER — Ambulatory Visit (INDEPENDENT_AMBULATORY_CARE_PROVIDER_SITE_OTHER): Payer: BC Managed Care – PPO | Admitting: Pulmonary Disease

## 2021-01-15 ENCOUNTER — Encounter: Payer: Self-pay | Admitting: Pulmonary Disease

## 2021-01-15 VITALS — BP 148/86 | HR 76 | Temp 97.6°F | Ht 66.0 in | Wt 275.0 lb

## 2021-01-15 DIAGNOSIS — J4520 Mild intermittent asthma, uncomplicated: Secondary | ICD-10-CM | POA: Diagnosis not present

## 2021-01-15 DIAGNOSIS — G4730 Sleep apnea, unspecified: Secondary | ICD-10-CM

## 2021-01-15 DIAGNOSIS — E669 Obesity, unspecified: Secondary | ICD-10-CM | POA: Diagnosis not present

## 2021-01-15 DIAGNOSIS — G4733 Obstructive sleep apnea (adult) (pediatric): Secondary | ICD-10-CM

## 2021-01-15 NOTE — Progress Notes (Signed)
[facility]  Chief Complaint  Patient presents with  . Follow-up    New patient- OSA on Bipap currently    Constitutional:  BP (!) 148/86 (BP Location: Right Arm, Cuff Size: Large)   Pulse 76   Temp 97.6 F (36.4 C) (Temporal)   Ht 5\' 6"  (1.676 m)   Wt 275 lb (124.7 kg)   SpO2 98% Comment: Room air  BMI 44.39 kg/m   Past Medical History:  Anxiety, Depression  Past Surgical History:  She  has a past surgical history that includes Dilation and curettage of uterus (1994).  Brief Summary:  Jasmine Cardenas is a 60 y.o. female with obstructive sleep apnea.      Subjective:   Previously seen by Dr. 46 and Dr. Shelle Iron.  Most recently seen by Dr. Jonell Cluck.  Sleep study from 2011 showed severe sleep apnea.  She was tried on CPAP 20 cm H2O, but was not able to tolerate setting.  She was eventually transitioned to Bipap. She was doing well with this.  She had follow up with Dr. 2012 office last year and was advised she needed a sleep study to update her status.  She was changed to Bipap 18/13 cm H2O.  Around this time she developed some type of respiratory infection.  She had Bipap changed to 13/9 cm H2O.  At some point she was also tried on auto Bipap.  Her sleep was terrible.  She ultimately had Bipap changed back to 18/13 cm H2O and sleeping much better now.  She is not having cough, wheeze, or sputum.  No issues with mask fit.    Bipap download shows significant improvement in AHI with change to Bipap 18/13 cm H2O with average AHI roughly around 10.  Physical Exam:   Appearance - well kempt   ENMT - no sinus tenderness, no oral exudate, no LAN, Mallampati 3 airway, no stridor  Respiratory - equal breath sounds bilaterally, no wheezing or rales  CV - s1s2 regular rate and rhythm, no murmurs  Ext - no clubbing, no edema  Skin - no rashes  Psych - normal mood and affect   Pulmonary testing:    Chest Imaging:   CT  angio chest 11/15/20 >> trace GGO in lingula  Sleep Tests:   PSG 04/03/10 >> AHI 90, SpO2 low 78%  Cardiac Tests:    Social History:  She  reports that she has never smoked. She has never used smokeless tobacco. She reports previous alcohol use. She reports previous drug use.  Family History:  Her family history includes Colon polyps in her father and sister.     Assessment/Plan:   Obstructive sleep apnea. - she was unable to tolerate CPAP in spite of trying different pressure settings and mask fits - has been using Bipap for years - she is compliant with Bipap and reports benefit from therapy - she is doing well on current setting of Bipap 18/13 cm H2O - explained that we will balance control of her sleep apnea with maintaining settings so that her set up is comfortable  Asthma. - continue trelegy and singulair for now - if her symptoms continue to improve, then likely can start to step down her regimen at next visit  Obesity. - discussed importance of weight loss  Time Spent Involved in Patient Care on Day of Examination:  33 minutes  Follow up:  Patient Instructions  Follow up in 4 months   Medication List:  Allergies as of 01/15/2021      Reactions   Fluoxetine Other (See Comments)   Increase Anxiety Level      Medication List       Accurate as of January 15, 2021  5:18 PM. If you have any questions, ask your nurse or doctor.        albuterol 108 (90 Base) MCG/ACT inhaler Commonly known as: VENTOLIN HFA   busPIRone 15 MG tablet Commonly known as: BUSPAR Take 15 mg by mouth 2 (two) times daily as needed.   carbonyl iron 45 MG Tabs tablet Commonly known as: FEOSOL Take by mouth.   CO Q 10 PO Take by mouth.   ergocalciferol 1.25 MG (50000 UT) capsule Commonly known as: VITAMIN D2   escitalopram 20 MG tablet Commonly known as: LEXAPRO Take 20 mg by mouth daily.   ibuprofen 200 MG tablet Commonly known as: ADVIL Take 400 mg by mouth every 6  (six) hours as needed.   L-THEANINE PO Take 100 mg by mouth as needed.   losartan 25 MG tablet Commonly known as: COZAAR Take 25 mg by mouth daily.   melatonin 5 MG Tabs Take 5 mg by mouth.   montelukast 10 MG tablet Commonly known as: SINGULAIR Take 10 mg by mouth daily.   Trelegy Ellipta 100-62.5-25 MCG/INH Aepb Generic drug: Fluticasone-Umeclidin-Vilant Take 1 puff by mouth daily.       Signature:  Coralyn Helling, MD [facility] Pager - [phone] 01/15/2021, 5:18 PM

## 2021-01-15 NOTE — Patient Instructions (Signed)
Follow up in 4 months 

## 2021-01-26 DIAGNOSIS — J343 Hypertrophy of nasal turbinates: Secondary | ICD-10-CM | POA: Diagnosis not present

## 2021-01-26 DIAGNOSIS — J342 Deviated nasal septum: Secondary | ICD-10-CM | POA: Diagnosis not present

## 2021-01-26 DIAGNOSIS — D1809 Hemangioma of other sites: Secondary | ICD-10-CM | POA: Diagnosis not present

## 2021-01-26 DIAGNOSIS — J3489 Other specified disorders of nose and nasal sinuses: Secondary | ICD-10-CM | POA: Diagnosis not present

## 2021-05-08 DIAGNOSIS — J45909 Unspecified asthma, uncomplicated: Secondary | ICD-10-CM | POA: Diagnosis not present

## 2021-05-08 DIAGNOSIS — E538 Deficiency of other specified B group vitamins: Secondary | ICD-10-CM | POA: Diagnosis not present

## 2021-05-08 DIAGNOSIS — E785 Hyperlipidemia, unspecified: Secondary | ICD-10-CM | POA: Diagnosis not present

## 2021-05-08 DIAGNOSIS — E559 Vitamin D deficiency, unspecified: Secondary | ICD-10-CM | POA: Diagnosis not present

## 2021-05-08 DIAGNOSIS — I10 Essential (primary) hypertension: Secondary | ICD-10-CM | POA: Diagnosis not present

## 2021-05-08 DIAGNOSIS — D509 Iron deficiency anemia, unspecified: Secondary | ICD-10-CM | POA: Diagnosis not present

## 2021-06-15 ENCOUNTER — Ambulatory Visit: Payer: BC Managed Care – PPO | Admitting: Primary Care

## 2021-06-27 ENCOUNTER — Ambulatory Visit: Payer: BC Managed Care – PPO | Admitting: Acute Care

## 2021-07-05 ENCOUNTER — Other Ambulatory Visit: Payer: Self-pay

## 2021-07-05 ENCOUNTER — Ambulatory Visit (INDEPENDENT_AMBULATORY_CARE_PROVIDER_SITE_OTHER): Payer: BC Managed Care – PPO | Admitting: Adult Health

## 2021-07-05 ENCOUNTER — Encounter: Payer: Self-pay | Admitting: Adult Health

## 2021-07-05 DIAGNOSIS — J45909 Unspecified asthma, uncomplicated: Secondary | ICD-10-CM | POA: Insufficient documentation

## 2021-07-05 DIAGNOSIS — G4733 Obstructive sleep apnea (adult) (pediatric): Secondary | ICD-10-CM | POA: Diagnosis not present

## 2021-07-05 DIAGNOSIS — J4520 Mild intermittent asthma, uncomplicated: Secondary | ICD-10-CM

## 2021-07-05 NOTE — Assessment & Plan Note (Signed)
Severe obstructive sleep apnea with excellent control and compliance on nocturnal BiPAP.  Continue on current settings  Plan Patient Instructions  Continue on BIPAP At bedtime   Keep up good work.  Work on healthy weight  Do not drive if sleepy .   Albuterol inhaler As needed   Activity as tolerated.   Follow up with Dr. Craige Cotta  in 6 months with PFT and As needed

## 2021-07-05 NOTE — Patient Instructions (Signed)
Continue on BIPAP At bedtime   Keep up good work.  Work on healthy weight  Do not drive if sleepy .   Albuterol inhaler As needed   Activity as tolerated.   Follow up with Dr. Craige Cotta  in 6 months with PFT and As needed

## 2021-07-05 NOTE — Assessment & Plan Note (Signed)
Mild intermittent asthma.  Patient had a flare of her Trelegy inhaler. For now we will not restart maintenance inhaler.  Albuterol as needed.  Asthma action plan discussed.  Check PFTs on return.  Plan  Patient Instructions  Continue on BIPAP At bedtime   Keep up good work.  Work on healthy weight  Do not drive if sleepy .   Albuterol inhaler As needed   Activity as tolerated.   Follow up with Dr. Craige Cotta  in 6 months with PFT and As needed

## 2021-07-05 NOTE — Progress Notes (Signed)
@Patient  ID: , female    DOB: 1961-07-26, 60 y.o.   MRN: [id number]  Chief Complaint  Patient presents with   Follow-up    Referring provider: [id number], PA  HPI: 60 year old female never smoker  followed for obstructive sleep apnea and asthma  TEST/EVENTS :  PSG 04/03/10 >> AHI 90, SpO2 low 78%  CT angio chest 11/15/20 >> trace GGO in lingula  07/05/2021 Follow up : OSA and Asthma  Patient has underlying obstructive sleep apnea.  Is on nocturnal BiPAP.  Patient says she is doing well on BiPAP.  Wears it every single night.  Cannot sleep without it.  Patient's BiPAP download shows excellent compliance with 100% usage.  Daily average usage at 7.5 hours.  Patient is on auto BiPAP IPAP 18, EPAP 13 cm H2O.  AHI is 7.9. no significant daytime sleepiness.   Patient has underlying asthma.  Had previously been on Trelegy inhaler. Weaned herself off 3 months ago, feels she is doing about the same since stopping. No flare in cough or wheezing. No increased albuterol use. No change in activity tolerance   Works fulltime at retirement center.      Allergies  Allergen Reactions   Fluoxetine Other (See Comments)    Increase Anxiety Level    Immunization History  Administered Date(s) Administered   Moderna Sars-Covid-2 Vaccination 11/25/2019, 12/23/2019, 08/29/2020    Past Medical History:  Diagnosis Date   Allergy    Anemia    Anxiety    Asthma    Depression    Sleep apnea    uses BiPAP    Tobacco History: Social History   Tobacco Use  Smoking Status Never  Smokeless Tobacco Never   Counseling given: Not Answered   Outpatient Medications Prior to Visit  Medication Sig Dispense Refill   albuterol (VENTOLIN HFA) 108 (90 Base) MCG/ACT inhaler      busPIRone (BUSPAR) 30 MG tablet Take 30 mg by mouth 2 (two) times daily.     carbonyl iron (FEOSOL) 45 MG TABS tablet Take by mouth.     Coenzyme Q10 (CO Q 10 PO) Take by mouth.     escitalopram (LEXAPRO) 20 MG  tablet Take 20 mg by mouth daily.     ibuprofen (ADVIL) 200 MG tablet Take 400 mg by mouth every 6 (six) hours as needed.     L-THEANINE PO Take 100 mg by mouth as needed.     losartan (COZAAR) 25 MG tablet Take 25 mg by mouth daily.     melatonin 5 MG TABS Take 5 mg by mouth.     montelukast (SINGULAIR) 10 MG tablet Take 10 mg by mouth daily.     busPIRone (BUSPAR) 15 MG tablet Take 15 mg by mouth 2 (two) times daily as needed. (Patient not taking: Reported on 07/05/2021)     ergocalciferol (VITAMIN D2) 1.25 MG (50000 UT) capsule  (Patient not taking: No sig reported)     TRELEGY ELLIPTA 100-62.5-25 MCG/INH AEPB Take 1 puff by mouth daily. (Patient not taking: Reported on 07/05/2021)     Facility-Administered Medications Prior to Visit  Medication Dose Route Frequency Provider Last Rate Last Admin   0.9 %  sodium chloride infusion  500 mL Intravenous Continuous 09/04/2021, MD         Review of Systems:   Constitutional:   No  weight loss, night sweats,  Fevers, chills, fatigue, or  lassitude.  HEENT:   No headaches,  Difficulty swallowing,  Tooth/dental problems, or  Sore throat,                No sneezing, itching, ear ache, nasal congestion, post nasal drip,   CV:  No chest pain,  Orthopnea, PND, swelling in lower extremities, anasarca, dizziness, palpitations, syncope.   GI  No heartburn, indigestion, abdominal pain, nausea, vomiting, diarrhea, change in bowel habits, loss of appetite, bloody stools.   Resp: No shortness of breath with exertion or at rest.  No excess mucus, no productive cough,  No non-productive cough,  No coughing up of blood.  No change in color of mucus.  No wheezing.  No chest wall deformity  Skin: no rash or lesions.  GU: no dysuria, change in color of urine, no urgency or frequency.  No flank pain, no hematuria   MS:  No joint pain or swelling.  No decreased range of motion.  No back pain.    Physical Exam  BP 136/76 (BP Location: Left Arm, Patient  Position: Sitting, Cuff Size: Large)   Pulse 77   Temp 98.1 F (36.7 C) (Oral)   Ht 5\' 6"  (1.676 m)   Wt 278 lb (126.1 kg)   SpO2 97%   BMI 44.87 kg/m   GEN: A/Ox3; pleasant , NAD, well nourished    HEENT:  [place]/AT,   NOSE-clear, THROAT-clear, no lesions, no postnasal drip or exudate noted.   NECK:  Supple w/ fair ROM; no JVD; normal carotid impulses w/o bruits; no thyromegaly or nodules palpated; no lymphadenopathy.    RESP  Clear  P & A; w/o, wheezes/ rales/ or rhonchi. no accessory muscle use, no dullness to percussion  CARD:  RRR, no m/r/g, no peripheral edema, pulses intact, no cyanosis or clubbing.  GI:   Soft & nt; nml bowel sounds; no organomegaly or masses detected.   Musco: Warm bil, no deformities or joint swelling noted.   Neuro: alert, no focal deficits noted.    Skin: Warm, no lesions or rashes    Lab Results:  CBC No results found for: WBC, RBC, HGB, HCT, PLT, MCV, MCH, MCHC, RDW, LYMPHSABS, MONOABS, EOSABS, BASOSABS  BMET No results found for: NA, K, CL, CO2, GLUCOSE, BUN, CREATININE, CALCIUM, GFRNONAA, GFRAA  BNP No results found for: BNP  ProBNP No results found for: PROBNP  Imaging: No results found.    No flowsheet data found.  No results found for: NITRICOXIDE      Assessment & Plan:   Asthma Mild intermittent asthma.  Patient had a flare of her Trelegy inhaler. For now we will not restart maintenance inhaler.  Albuterol as needed.  Asthma action plan discussed.  Check PFTs on return.  Plan  Patient Instructions  Continue on BIPAP At bedtime   Keep up good work.  Work on healthy weight  Do not drive if sleepy .   Albuterol inhaler As needed   Activity as tolerated.   Follow up with Dr.  in 6 months with PFT and As needed        OBSTRUCTIVE SLEEP APNEA Severe obstructive sleep apnea with excellent control and compliance on nocturnal BiPAP.  Continue on current settings  Plan Patient Instructions  Continue on  BIPAP At bedtime   Keep up good work.  Work on healthy weight  Do not drive if sleepy .   Albuterol inhaler As needed   Activity as tolerated.   Follow up with Dr. Craige Cotta  in 6 months with PFT and As needed  Morbid obesity (HCC) Healthy weight loss discussed     Rubye Oaks, NP 07/05/2021

## 2021-07-05 NOTE — Assessment & Plan Note (Signed)
Healthy weight loss discussed 

## 2021-07-06 NOTE — Progress Notes (Signed)
Reviewed and agree with assessment/plan.   Chanze Teagle, MD [facility] 07/06/2021, 10:07 AM Pager:  [phone]  

## 2021-09-10 DIAGNOSIS — F411 Generalized anxiety disorder: Secondary | ICD-10-CM | POA: Diagnosis not present

## 2021-09-12 DIAGNOSIS — E559 Vitamin D deficiency, unspecified: Secondary | ICD-10-CM | POA: Diagnosis not present

## 2021-09-12 DIAGNOSIS — E785 Hyperlipidemia, unspecified: Secondary | ICD-10-CM | POA: Diagnosis not present

## 2021-09-12 DIAGNOSIS — Z79899 Other long term (current) drug therapy: Secondary | ICD-10-CM | POA: Diagnosis not present

## 2021-09-12 DIAGNOSIS — E538 Deficiency of other specified B group vitamins: Secondary | ICD-10-CM | POA: Diagnosis not present

## 2021-09-12 DIAGNOSIS — J45909 Unspecified asthma, uncomplicated: Secondary | ICD-10-CM | POA: Diagnosis not present

## 2021-09-12 DIAGNOSIS — Z780 Asymptomatic menopausal state: Secondary | ICD-10-CM | POA: Diagnosis not present

## 2021-09-12 DIAGNOSIS — D509 Iron deficiency anemia, unspecified: Secondary | ICD-10-CM | POA: Diagnosis not present

## 2021-09-12 DIAGNOSIS — I10 Essential (primary) hypertension: Secondary | ICD-10-CM | POA: Diagnosis not present

## 2021-09-19 DIAGNOSIS — N952 Postmenopausal atrophic vaginitis: Secondary | ICD-10-CM | POA: Diagnosis not present

## 2021-09-19 DIAGNOSIS — R6882 Decreased libido: Secondary | ICD-10-CM | POA: Diagnosis not present

## 2021-09-19 DIAGNOSIS — N393 Stress incontinence (female) (male): Secondary | ICD-10-CM | POA: Diagnosis not present

## 2021-09-19 DIAGNOSIS — Z79899 Other long term (current) drug therapy: Secondary | ICD-10-CM | POA: Diagnosis not present

## 2021-09-19 DIAGNOSIS — N951 Menopausal and female climacteric states: Secondary | ICD-10-CM | POA: Diagnosis not present

## 2021-11-08 DIAGNOSIS — Z1231 Encounter for screening mammogram for malignant neoplasm of breast: Secondary | ICD-10-CM | POA: Diagnosis not present

## 2021-11-12 DIAGNOSIS — F411 Generalized anxiety disorder: Secondary | ICD-10-CM | POA: Diagnosis not present

## 2022-01-07 DIAGNOSIS — N951 Menopausal and female climacteric states: Secondary | ICD-10-CM | POA: Diagnosis not present

## 2022-01-07 DIAGNOSIS — N393 Stress incontinence (female) (male): Secondary | ICD-10-CM | POA: Diagnosis not present

## 2022-01-07 DIAGNOSIS — N952 Postmenopausal atrophic vaginitis: Secondary | ICD-10-CM | POA: Diagnosis not present

## 2022-01-07 DIAGNOSIS — R6882 Decreased libido: Secondary | ICD-10-CM | POA: Diagnosis not present

## 2022-01-28 DIAGNOSIS — Z01419 Encounter for gynecological examination (general) (routine) without abnormal findings: Secondary | ICD-10-CM | POA: Diagnosis not present

## 2022-01-28 DIAGNOSIS — D509 Iron deficiency anemia, unspecified: Secondary | ICD-10-CM | POA: Diagnosis not present

## 2022-01-28 DIAGNOSIS — E559 Vitamin D deficiency, unspecified: Secondary | ICD-10-CM | POA: Diagnosis not present

## 2022-01-28 DIAGNOSIS — Z6841 Body Mass Index (BMI) 40.0 and over, adult: Secondary | ICD-10-CM | POA: Diagnosis not present

## 2022-01-28 DIAGNOSIS — E785 Hyperlipidemia, unspecified: Secondary | ICD-10-CM | POA: Diagnosis not present

## 2022-01-28 DIAGNOSIS — Z0001 Encounter for general adult medical examination with abnormal findings: Secondary | ICD-10-CM | POA: Diagnosis not present

## 2022-01-28 DIAGNOSIS — E538 Deficiency of other specified B group vitamins: Secondary | ICD-10-CM | POA: Diagnosis not present

## 2022-02-06 DIAGNOSIS — F411 Generalized anxiety disorder: Secondary | ICD-10-CM | POA: Diagnosis not present

## 2022-04-02 IMAGING — CT CT ANGIO CHEST
2 of 8 series · 10 of 36 positions shown · IV contrast (iopamidol)
Comparison: 11/15/2020

CLINICAL DATA: Chest pain, history of pleural effusions.

EXAM:
CT ANGIOGRAPHY CHEST WITH CONTRAST
TECHNIQUE: Multidetector CT imaging of the chest was performed using the
standard protocol during bolus administration of intravenous
contrast. Multiplanar CT image reconstructions and MIPs were
obtained to evaluate the vascular anatomy.
CONTRAST:  75mL [id number] IOPAMIDOL ([id number]) INJECTION 76%

[Series 9: cta pulmonary 2.00 bv36 s3 · coronal · 0.64mm/px · 1 of 224 slices shown]
[im 112/224  mediastinal]
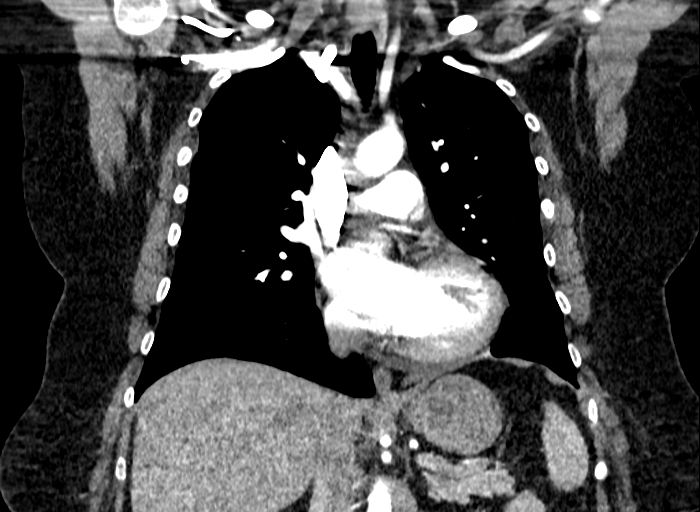

[Series 14: cta pulmonary 1.00 bv36 s3 super d. · axial · 0.88mm/px · z∈[+1481,+1743]mm · 9 of 409 slices shown]
[im 41/409  lung]
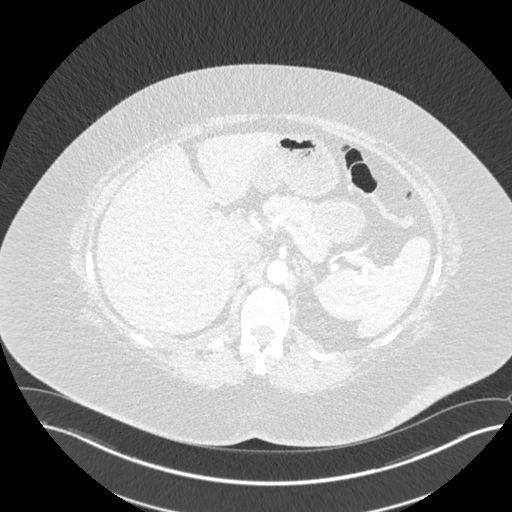
[im 82/409  mediastinal]
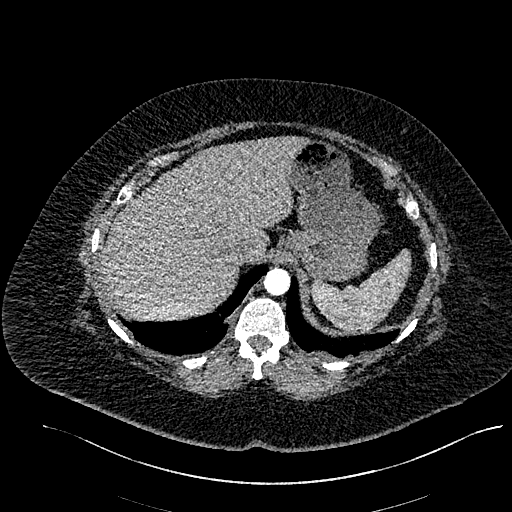
[im 123/409  lung]
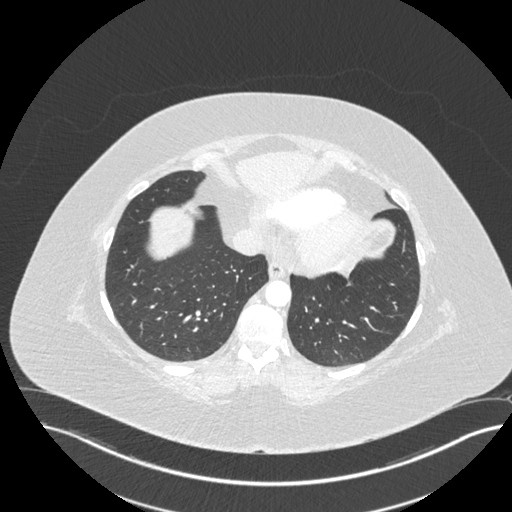
[im 164/409  mediastinal]
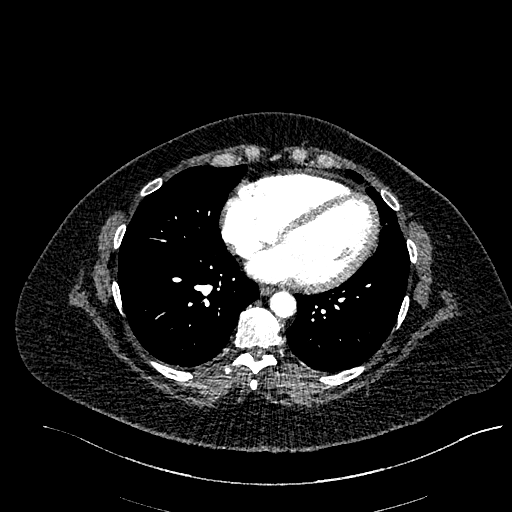
[im 205/409  lung]
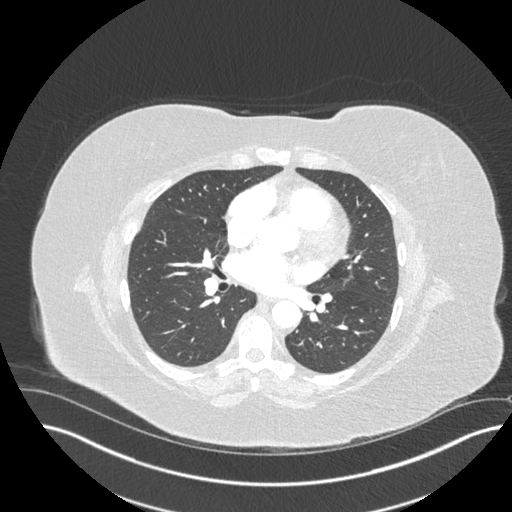
[im 245/409  mediastinal]
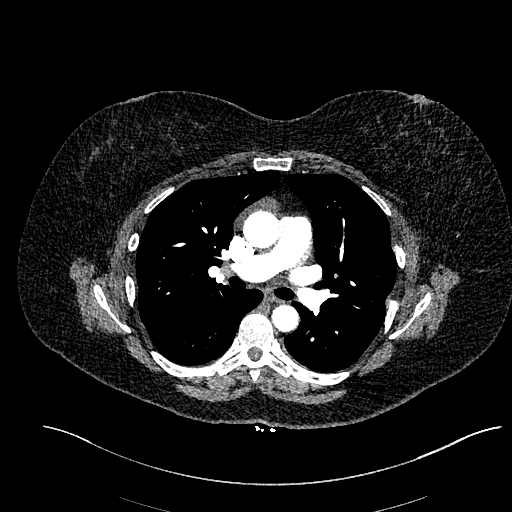
[im 286/409  lung]
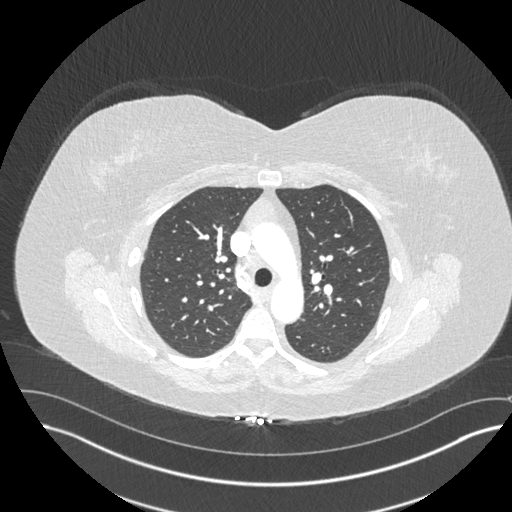
[im 327/409  mediastinal]
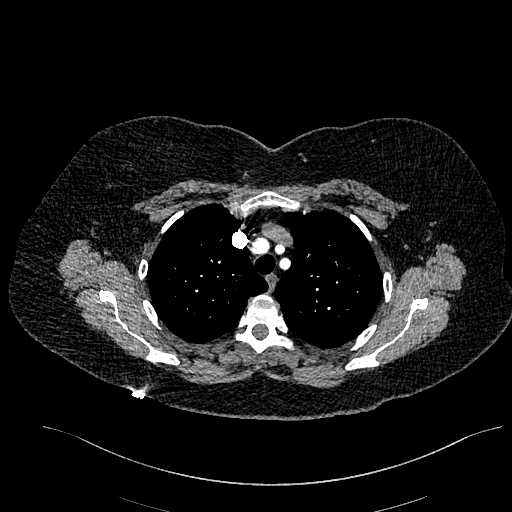
[im 368/409  lung]
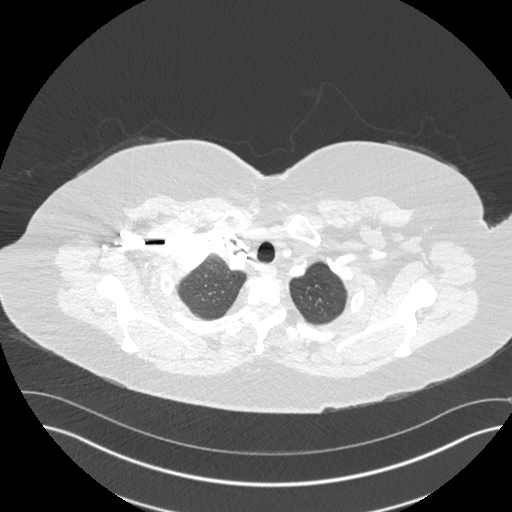

[10 of 36 positions shown; findings below may reference images not displayed]

FINDINGS: Cardiovascular: Normal caliber thoracic aorta with scattered
atheromatous plaque. Normal heart size. No pericardial effusion
beyond small amount of fluid in superior pericardial recess.

No signs of pulmonary embolism.

Mediastinum/Nodes: Thoracic inlet structures are normal. No axillary
no hilar adenopathy. No mediastinal adenopathy. Thoracic inlet
structures are normal.

Lungs/Pleura: Trace ground-glass in the lingula. No dense
consolidation. Trace pleural fluid at the lung bases with minimal
basilar atelectasis airways are patent.

Upper Abdomen: Incidental imaging of upper abdominal contents
without acute process imaged portions the liver, pancreas, spleen,
adrenal glands, kidneys and gastrointestinal tract are unremarkable.

Musculoskeletal: No acute musculoskeletal process.

Review of the MIP images confirms the above findings.
IMPRESSION: 1. No evidence of pulmonary embolism.
2. Trace pleural fluid at the lung bases with minimal basilar
atelectasis.
3. Trace ground-glass in the lingula may represent mild pneumonitis.
4. Aortic atherosclerosis.

Aortic Atherosclerosis ([id number]).

## 2022-05-13 DIAGNOSIS — F411 Generalized anxiety disorder: Secondary | ICD-10-CM | POA: Diagnosis not present

## 2022-07-16 DIAGNOSIS — N393 Stress incontinence (female) (male): Secondary | ICD-10-CM | POA: Diagnosis not present

## 2022-07-16 DIAGNOSIS — N952 Postmenopausal atrophic vaginitis: Secondary | ICD-10-CM | POA: Diagnosis not present

## 2022-07-16 DIAGNOSIS — N951 Menopausal and female climacteric states: Secondary | ICD-10-CM | POA: Diagnosis not present

## 2022-07-16 DIAGNOSIS — R6882 Decreased libido: Secondary | ICD-10-CM | POA: Diagnosis not present

## 2022-08-12 DIAGNOSIS — F411 Generalized anxiety disorder: Secondary | ICD-10-CM | POA: Diagnosis not present

## 2022-12-09 ENCOUNTER — Ambulatory Visit (INDEPENDENT_AMBULATORY_CARE_PROVIDER_SITE_OTHER): Payer: BC Managed Care – PPO | Admitting: Pulmonary Disease

## 2022-12-09 ENCOUNTER — Encounter (HOSPITAL_BASED_OUTPATIENT_CLINIC_OR_DEPARTMENT_OTHER): Payer: Self-pay | Admitting: Pulmonary Disease

## 2022-12-09 VITALS — BP 140/84 | HR 58 | Temp 98.1°F | Ht 66.0 in | Wt 286.0 lb

## 2022-12-09 DIAGNOSIS — J4550 Severe persistent asthma, uncomplicated: Secondary | ICD-10-CM

## 2022-12-09 DIAGNOSIS — G4733 Obstructive sleep apnea (adult) (pediatric): Secondary | ICD-10-CM

## 2022-12-09 MED ORDER — TRELEGY ELLIPTA 100-62.5-25 MCG/ACT IN AEPB: 1.0000 | INHALATION_SPRAY | Freq: Every day | RESPIRATORY_TRACT | 5 refills | Status: DC

## 2022-12-09 MED ORDER — ALBUTEROL SULFATE HFA 108 (90 BASE) MCG/ACT IN AERS: 2.0000 | INHALATION_SPRAY | Freq: Four times a day (QID) | RESPIRATORY_TRACT | 5 refills | Status: AC | PRN

## 2022-12-09 NOTE — Patient Instructions (Signed)
Trelegy 1 puff daily, and rinse your mouth after each use.  Albuterol 2 puffs every 6 hours as needed for cough, wheeze, chest congestion, or shortness of breath.  Will have [place] Patient arrange for a new Bipap machine.  Follow up in 2 months.

## 2022-12-09 NOTE — Progress Notes (Signed)
[facility]  Chief Complaint  Patient presents with   Follow-up    Need new bipap and sob noticed gotten worse within the last year using albuterol more    Constitutional:  BP (!) 140/84 (BP Location: Left Wrist, Cuff Size: Large)   Pulse (!) 58   Temp 98.1 F (36.7 C) (Oral)   Ht 5\' 6"  (1.676 m)   Wt 286 lb (129.7 kg)   SpO2 98%   BMI 46.16 kg/m   Past Medical History:  Anxiety, Depression  Past Surgical History:  She  has a past surgical history that includes Dilation and curettage of uterus (1994).  Brief Summary:  Jasmine Cardenas is a 62 y.o. female with obstructive sleep apnea.      Subjective:   She stopped using trelegy and singulair.  Breathing has gotten worse.  It is harder for her to go for walks.  She is needing to use albuterol several times per week now.  Uses Bipap nightly.  Has full face mask.  His machine is old, and has error message saying machine motor expired.  Physical Exam:   Appearance - well kempt   ENMT - no sinus tenderness, no oral exudate, no LAN, Mallampati 3 airway, no stridor  Respiratory - equal breath sounds bilaterally, no wheezing or rales  CV - s1s2 regular rate and rhythm, no murmurs  Ext - no clubbing, no edema  Skin - no rashes  Psych - normal mood and affect    Pulmonary testing:    Chest Imaging:  CT angio chest 11/15/20 >> trace GGO in lingula  Sleep Tests:  PSG 04/03/10 >> AHI 90, SpO2 low 78%  Social History:  She  reports that she has never smoked. She has never used smokeless tobacco. She reports that she does not currently use alcohol. She reports that she does not currently use drugs.  Family History:  Her family history includes Colon polyps in her father and sister.     Assessment/Plan:   Obstructive sleep apnea. - she was unable to tolerate CPAP in spite of trying different pressure settings and mask fits - has been using Bipap for years - she is  compliant with Bipap and reports benefit from therapy - she uses [facility] for her DME - her current machine is more than 62 yrs old - will arrange for new [facility] Bipap at 18/13 cm H2O and replacement supplies  Severe, persistent asthma. - restart trelegy 100 one puff daily - prn albuterol  Obesity. - discussed importance of weight loss  Time Spent Involved in Patient Care on Day of Examination:  35 minutes  Follow up:   Patient Instructions  Trelegy 1 puff daily, and rinse your mouth after each use.  Albuterol 2 puffs every 6 hours as needed for cough, wheeze, chest congestion, or shortness of breath.  Will have [facility] arrange for a new Bipap machine.  Follow up in 2 months.  Medication List:   Allergies as of 12/09/2022       Reactions   Fluoxetine Other (See Comments)   Increase Anxiety Level        Medication List        Accurate as of December 09, 2022  3:48 PM. If you have any questions, ask your nurse or doctor.          STOP taking these medications    CO Q 10 PO Stopped by: Chesley Mires, MD  montelukast 10 MG tablet Commonly known as: SINGULAIR Stopped by: Chesley Mires, MD       TAKE these medications    albuterol 108 (90 Base) MCG/ACT inhaler Commonly known as: VENTOLIN HFA Inhale 2 puffs into the lungs every 6 (six) hours as needed for wheezing or shortness of breath. What changed:  how much to take how to take this when to take this reasons to take this Changed by: Chesley Mires, MD   ALPRAZolam 0.5 MG tablet Commonly known as: XANAX Take 0.5 mg by mouth 2 (two) times daily as needed.   busPIRone 30 MG tablet Commonly known as: BUSPAR Take 30 mg by mouth 2 (two) times daily.   carbonyl iron 45 MG Tabs tablet Commonly known as: FEOSOL Take by mouth.   escitalopram 20 MG tablet Commonly known as: LEXAPRO Take 20 mg by mouth daily.   estradiol 0.5 MG tablet Commonly known as: ESTRACE SMARTSIG:1  Tablet(s) By Mouth Every Evening   ibuprofen 200 MG tablet Commonly known as: ADVIL Take 400 mg by mouth every 6 (six) hours as needed.   L-THEANINE PO Take 100 mg by mouth as needed.   losartan 25 MG tablet Commonly known as: COZAAR Take 25 mg by mouth daily.   melatonin 5 MG Tabs Take 5 mg by mouth.   progesterone 100 MG capsule Commonly known as: PROMETRIUM SMARTSIG:1 Capsule(s) By Mouth Every Evening   Trelegy Ellipta 100-62.5-25 MCG/ACT Aepb Generic drug: Fluticasone-Umeclidin-Vilant Inhale 1 puff into the lungs daily in the afternoon. Started by: Chesley Mires, MD   Vitamin D (Ergocalciferol) 50000 units Caps Take 1 capsule by mouth once a week.        Signature:  Chesley Mires, MD [place] Pager - [phone] 12/09/2022, 3:48 PM

## 2023-02-14 ENCOUNTER — Ambulatory Visit (HOSPITAL_BASED_OUTPATIENT_CLINIC_OR_DEPARTMENT_OTHER): Payer: BC Managed Care – PPO | Admitting: Pulmonary Disease

## 2023-03-18 ENCOUNTER — Ambulatory Visit (INDEPENDENT_AMBULATORY_CARE_PROVIDER_SITE_OTHER): Payer: BC Managed Care – PPO | Admitting: Pulmonary Disease

## 2023-03-18 ENCOUNTER — Encounter (HOSPITAL_BASED_OUTPATIENT_CLINIC_OR_DEPARTMENT_OTHER): Payer: Self-pay | Admitting: Pulmonary Disease

## 2023-03-18 VITALS — BP 132/80 | HR 70 | Temp 98.5°F | Ht 66.0 in | Wt 291.2 lb

## 2023-03-18 DIAGNOSIS — G4733 Obstructive sleep apnea (adult) (pediatric): Secondary | ICD-10-CM | POA: Diagnosis not present

## 2023-03-18 DIAGNOSIS — J4550 Severe persistent asthma, uncomplicated: Secondary | ICD-10-CM | POA: Diagnosis not present

## 2023-03-18 NOTE — Patient Instructions (Signed)
Follow up in 6 months 

## 2023-03-18 NOTE — Progress Notes (Signed)
[facility]  Chief Complaint  Patient presents with   Follow-up    Follow up. Patient has no complaints.     Constitutional:  BP 132/80 (BP Location: Right Wrist, Patient Position: Sitting, Cuff Size: Normal)   Pulse 70   Temp 98.5 F (36.9 C) (Oral)   Ht 5\' 6"  (1.676 m)   Wt 291 lb 3.2 oz (132.1 kg)   SpO2 95%   BMI 47.00 kg/m   Past Medical History:  Anxiety, Depression  Past Surgical History:  She  has a past surgical history that includes Dilation and curettage of uterus (1994).  Brief Summary:  Jasmine Cardenas is a 62 y.o. female with obstructive sleep apnea and asthma.      Subjective:   Uses Bipap nightly.  No issues with mask fit or pressure setting.  Feels rested.  Not having sinus congestion or dry mouth.  Breathing much better since getting back on trelegy.  Not needing to use albuterol much.  Not having cough, wheeze, or sputum.  She is able to keep up with yard work around her house much better now.  Physical Exam:   Appearance - well kempt   ENMT - no sinus tenderness, no oral exudate, no LAN, Mallampati 3 airway, no stridor  Respiratory - equal breath sounds bilaterally, no wheezing or rales  CV - s1s2 regular rate and rhythm, no murmurs  Ext - no clubbing, no edema  Skin - no rashes  Psych - normal mood and affect     Pulmonary testing:    Chest Imaging:  CT angio chest 11/15/20 >> trace GGO in lingula  Sleep Tests:  PSG 04/03/10 >> AHI 90, SpO2 low 78% Bipap 02/16/23 to 03/17/23 >> used on 30 of 30 nights with average 8 hrs 42 min.  Average AHI 3.9 with Bipap 18/13 cm H2O  Social History:  She  reports that she has never smoked. She has never used smokeless tobacco. She reports that she does not currently use alcohol. She reports that she does not currently use drugs.  Family History:  Her family history includes Colon polyps in her father and sister.     Assessment/Plan:   Obstructive  sleep apnea. - she is compliant with Bipap and reports benefit from therapy - she uses [facility] for her DME - current Bipap ordered February 2024 - continue Bipap 18/13 cm H2O  Severe, persistent asthma. - much better since getting back on trelegy - discussed concept of step up and step down therapy with asthma - reviewed different side effect profiles for ICS, LABA, and LAMA inhalers - continue trelegy 100 one puff daily - prn albuterol  Obesity. - discussed importance of weight loss  Time Spent Involved in Patient Care on Day of Examination:  28 minutes  Follow up:   Patient Instructions  Follow up in 6 months  Medication List:   Allergies as of 03/18/2023       Reactions   Fluoxetine Other (See Comments)   Increase Anxiety Level        Medication List        Accurate as of Mar 18, 2023  3:51 PM. If you have any questions, ask your nurse or doctor.          albuterol 108 (90 Base) MCG/ACT inhaler Commonly known as: VENTOLIN HFA Inhale 2 puffs into the lungs every 6 (six) hours as needed for wheezing or shortness of breath.  ALPRAZolam 0.5 MG tablet Commonly known as: XANAX Take 0.5 mg by mouth 2 (two) times daily as needed.   busPIRone 30 MG tablet Commonly known as: BUSPAR Take 30 mg by mouth 2 (two) times daily.   carbonyl iron 45 MG Tabs tablet Commonly known as: FEOSOL Take by mouth.   escitalopram 20 MG tablet Commonly known as: LEXAPRO Take 20 mg by mouth daily.   estradiol 0.5 MG tablet Commonly known as: ESTRACE SMARTSIG:1 Tablet(s) By Mouth Every Evening   ibuprofen 200 MG tablet Commonly known as: ADVIL Take 400 mg by mouth every 6 (six) hours as needed.   L-THEANINE PO Take 100 mg by mouth as needed.   losartan 25 MG tablet Commonly known as: COZAAR Take 25 mg by mouth daily.   melatonin 5 MG Tabs Take 5 mg by mouth.   progesterone 100 MG capsule Commonly known as: PROMETRIUM SMARTSIG:1 Capsule(s) By Mouth  Every Evening   Trelegy Ellipta 100-62.5-25 MCG/ACT Aepb Generic drug: Fluticasone-Umeclidin-Vilant Inhale 1 puff into the lungs daily in the afternoon.   Vitamin D (Ergocalciferol) 50000 units Caps Take 1 capsule by mouth once a week.        Signature:  Coralyn Helling, MD [facility] Pager - [phone] 03/18/2023, 3:51 PM

## 2023-06-27 ENCOUNTER — Other Ambulatory Visit (HOSPITAL_BASED_OUTPATIENT_CLINIC_OR_DEPARTMENT_OTHER): Payer: Self-pay | Admitting: Pulmonary Disease
